# Patient Record
Sex: Female | Born: 1995 | Hispanic: Yes | Marital: Single | Smoking: Never smoker
Health system: Southern US, Community
[De-identification: ages and names within clinical notes are randomized; demographics above are authoritative.]

---

## 2018-06-30 ENCOUNTER — Other Ambulatory Visit: Payer: Self-pay

## 2018-06-30 ENCOUNTER — Telehealth: Payer: Self-pay

## 2018-06-30 DIAGNOSIS — Z20822 Contact with and (suspected) exposure to covid-19: Secondary | ICD-10-CM

## 2018-06-30 NOTE — Telephone Encounter (Signed)
Ariel Ferguson with [facility]. Requests COVID 19 test for symptoms. Reports she is at the test site now. Order placed.

## 2018-07-02 LAB — NOVEL CORONAVIRUS, NAA: SARS-CoV-2, NAA: NOT DETECTED

## 2018-07-05 ENCOUNTER — Telehealth: Payer: Self-pay

## 2018-07-05 NOTE — Telephone Encounter (Signed)
Patient called and was given her negative covid results by the [facility]. She was transferred to NT due to questions she had. The patient says that she was still having symptoms on Sunday-feeling fatigue, feeling feverish (no thermometer to check her temperature), chest feeling tight. She says yesterday all those symptoms went away, but she had a headache and a little fatigue, she took Ibuprofen. Today she says she feels better, no symptoms at all. I advised because she was still having symptoms, she will need to self isolate for 10 days and 3 days without fever and not taking any medication for fever, which means she will need to remain on self isolation until 07/12/18. She asked is there a note that she can give to her employer, I advised there is a note that can be sent through [facility] that indicates about self isolation for a negative test result with symptoms. I advised how to set up the MyChart account, she verbalized understanding of all advice given and received the code via text to set up [place].

## 2019-09-10 ENCOUNTER — Emergency Department: Payer: Self-pay

## 2019-09-10 ENCOUNTER — Emergency Department
Admission: EM | Admit: 2019-09-10 | Discharge: 2019-09-10 | Disposition: A | Discharge status: Home / Self Care | Payer: Self-pay | Attending: Emergency Medicine | Admitting: Emergency Medicine

## 2019-09-10 ENCOUNTER — Encounter: Payer: Self-pay | Admitting: Emergency Medicine

## 2019-09-10 ENCOUNTER — Other Ambulatory Visit: Payer: Self-pay

## 2019-09-10 DIAGNOSIS — R52 Pain, unspecified: Secondary | ICD-10-CM

## 2019-09-10 DIAGNOSIS — Y9384 Activity, sleeping: Secondary | ICD-10-CM | POA: Insufficient documentation

## 2019-09-10 DIAGNOSIS — Y999 Unspecified external cause status: Secondary | ICD-10-CM | POA: Insufficient documentation

## 2019-09-10 DIAGNOSIS — S8012XA Contusion of left lower leg, initial encounter: Secondary | ICD-10-CM | POA: Insufficient documentation

## 2019-09-10 DIAGNOSIS — W228XXA Striking against or struck by other objects, initial encounter: Secondary | ICD-10-CM | POA: Insufficient documentation

## 2019-09-10 DIAGNOSIS — Y92009 Unspecified place in unspecified non-institutional (private) residence as the place of occurrence of the external cause: Secondary | ICD-10-CM | POA: Insufficient documentation

## 2019-09-10 LAB — POCT PREGNANCY, URINE: Preg Test, Ur: NEGATIVE

## 2019-09-10 MED ORDER — TRAMADOL HCL 50 MG PO TABS
50.0000 mg | ORAL_TABLET | Freq: Four times a day (QID) | ORAL | 0 refills | Status: DC | PRN
Start: 2019-09-10 — End: 2019-09-17

## 2019-09-10 MED ORDER — TRAMADOL HCL 50 MG PO TABS
50.0000 mg | ORAL_TABLET | Freq: Once | ORAL | Status: AC
  Administered 2019-09-10: 50 mg via ORAL
  Filled 2019-09-10: qty 1

## 2019-09-10 MED ORDER — MELOXICAM 15 MG PO TABS: 15.0000 mg | ORAL_TABLET | Freq: Every day | ORAL | 0 refills | Status: DC

## 2019-09-10 MED ORDER — METHOCARBAMOL 500 MG PO TABS
500.0000 mg | ORAL_TABLET | Freq: Once | ORAL | Status: AC
  Administered 2019-09-10: 500 mg via ORAL
  Filled 2019-09-10: qty 1

## 2019-09-10 MED ORDER — METHOCARBAMOL 500 MG PO TABS: 500.0000 mg | ORAL_TABLET | Freq: Four times a day (QID) | ORAL | 0 refills | Status: DC

## 2019-09-10 MED ORDER — MELOXICAM 7.5 MG PO TABS
15.0000 mg | ORAL_TABLET | Freq: Once | ORAL | Status: AC
  Administered 2019-09-10: 15 mg via ORAL
  Filled 2019-09-10: qty 2
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
## 2019-09-12 ENCOUNTER — Other Ambulatory Visit: Payer: Self-pay

## 2019-09-12 ENCOUNTER — Inpatient Hospital Stay
Admission: AD | Admit: 2019-09-12 | Discharge: 2019-09-17 | DRG: 881 | Disposition: A | Discharge status: Home / Self Care | Payer: No Typology Code available for payment source | Source: Intra-hospital | Attending: Psychiatry | Admitting: Psychiatry

## 2019-09-12 ENCOUNTER — Emergency Department
Admission: EM | Admit: 2019-09-12 | Discharge: 2019-09-12 | Disposition: A | Discharge status: Home / Self Care | Payer: Self-pay | Attending: Emergency Medicine | Admitting: Emergency Medicine

## 2019-09-12 ENCOUNTER — Encounter: Payer: Self-pay | Admitting: Psychiatry

## 2019-09-12 DIAGNOSIS — F430 Acute stress reaction: Secondary | ICD-10-CM | POA: Diagnosis present

## 2019-09-12 DIAGNOSIS — R52 Pain, unspecified: Secondary | ICD-10-CM | POA: Insufficient documentation

## 2019-09-12 DIAGNOSIS — R45851 Suicidal ideations: Secondary | ICD-10-CM | POA: Insufficient documentation

## 2019-09-12 DIAGNOSIS — F329 Major depressive disorder, single episode, unspecified: Principal | ICD-10-CM | POA: Diagnosis present

## 2019-09-12 DIAGNOSIS — F4310 Post-traumatic stress disorder, unspecified: Secondary | ICD-10-CM | POA: Diagnosis present

## 2019-09-12 DIAGNOSIS — F331 Major depressive disorder, recurrent, moderate: Secondary | ICD-10-CM | POA: Insufficient documentation

## 2019-09-12 DIAGNOSIS — F322 Major depressive disorder, single episode, severe without psychotic features: Secondary | ICD-10-CM | POA: Diagnosis not present

## 2019-09-12 DIAGNOSIS — Z20822 Contact with and (suspected) exposure to covid-19: Secondary | ICD-10-CM | POA: Insufficient documentation

## 2019-09-12 DIAGNOSIS — F411 Generalized anxiety disorder: Secondary | ICD-10-CM | POA: Diagnosis present

## 2019-09-12 DIAGNOSIS — F515 Nightmare disorder: Secondary | ICD-10-CM | POA: Insufficient documentation

## 2019-09-12 LAB — CBC WITH DIFFERENTIAL/PLATELET
Abs Immature Granulocytes: 0.04 10*3/uL (ref 0.00–0.07)
Basophils Absolute: 0.1 10*3/uL (ref 0.0–0.1)
Basophils Relative: 1 %
Eosinophils Absolute: 0.1 10*3/uL (ref 0.0–0.5)
Eosinophils Relative: 1 %
HCT: 39.9 % (ref 36.0–46.0)
Hemoglobin: 13.6 g/dL (ref 12.0–15.0)
Immature Granulocytes: 0 %
Lymphocytes Relative: 26 %
Lymphs Abs: 2.5 10*3/uL (ref 0.7–4.0)
MCH: 28.4 pg (ref 26.0–34.0)
MCHC: 34.1 g/dL (ref 30.0–36.0)
MCV: 83.3 fL (ref 80.0–100.0)
Monocytes Absolute: 0.6 10*3/uL (ref 0.1–1.0)
Monocytes Relative: 6 %
Neutro Abs: 6.3 10*3/uL (ref 1.7–7.7)
Neutrophils Relative %: 66 %
Platelets: 385 10*3/uL (ref 150–400)
RBC: 4.79 MIL/uL (ref 3.87–5.11)
RDW: 12.1 % (ref 11.5–15.5)
WBC: 9.6 10*3/uL (ref 4.0–10.5)
nRBC: 0 % (ref 0.0–0.2)

## 2019-09-12 LAB — COMPREHENSIVE METABOLIC PANEL
ALT: 19 U/L (ref 0–44)
AST: 24 U/L (ref 15–41)
Albumin: 4.1 g/dL (ref 3.5–5.0)
Alkaline Phosphatase: 61 U/L (ref 38–126)
Anion gap: 10 (ref 5–15)
BUN: 13 mg/dL (ref 6–20)
CO2: 23 mmol/L (ref 22–32)
Calcium: 8.6 mg/dL — ABNORMAL LOW (ref 8.9–10.3)
Chloride: 102 mmol/L (ref 98–111)
Creatinine, Ser: 0.6 mg/dL (ref 0.44–1.00)
GFR calc Af Amer: >60 mL/min (ref >60)
GFR calc non Af Amer: >60 mL/min (ref >60)
Glucose, Bld: 89 mg/dL (ref 70–99)
Potassium: 3.4 mmol/L — ABNORMAL LOW (ref 3.5–5.1)
Sodium: 135 mmol/L (ref 135–145)
Total Bilirubin: 0.6 mg/dL (ref 0.3–1.2)
Total Protein: 7.5 g/dL (ref 6.5–8.1)

## 2019-09-12 LAB — SARS CORONAVIRUS 2 BY RT PCR (HOSPITAL ORDER, PERFORMED IN ~~LOC~~ HOSPITAL LAB): SARS Coronavirus 2: NEGATIVE

## 2019-09-12 LAB — ACETAMINOPHEN LEVEL: Acetaminophen (Tylenol), Serum: <10 ug/mL — ABNORMAL LOW (ref 10–30)

## 2019-09-12 LAB — SALICYLATE LEVEL: Salicylate Lvl: <7 mg/dL — ABNORMAL LOW (ref 7.0–30.0)

## 2019-09-12 MED ORDER — KETOROLAC TROMETHAMINE 30 MG/ML IJ SOLN
30.0000 mg | Freq: Once | INTRAMUSCULAR | Status: AC
  Administered 2019-09-12: 30 mg via INTRAMUSCULAR
  Filled 2019-09-12: qty 1

## 2019-09-12 MED ORDER — HYDROXYZINE HCL 50 MG PO TABS
50.0000 mg | ORAL_TABLET | Freq: Once | ORAL | Status: AC
  Administered 2019-09-12: 50 mg via ORAL
  Filled 2019-09-12: qty 1

## 2019-09-12 MED ORDER — MAGNESIUM HYDROXIDE 400 MG/5ML PO SUSP: 30.0000 mL | Freq: Every day | ORAL | Status: DC | PRN

## 2019-09-12 MED ORDER — ACETAMINOPHEN 325 MG PO TABS
650.0000 mg | ORAL_TABLET | Freq: Four times a day (QID) | ORAL | Status: DC | PRN
  Administered 2019-09-13 – 2019-09-17 (×6): 650 mg via ORAL
  Filled 2019-09-12 (×6): qty 2

## 2019-09-12 MED ORDER — ALUM & MAG HYDROXIDE-SIMETH 200-200-20 MG/5ML PO SUSP: 30.0000 mL | ORAL | Status: DC | PRN

## 2019-09-12 MED ORDER — HYDROXYZINE HCL 25 MG PO TABS
25.0000 mg | ORAL_TABLET | Freq: Three times a day (TID) | ORAL | Status: DC | PRN
  Administered 2019-09-12 – 2019-09-15 (×2): 25 mg via ORAL
  Filled 2019-09-12 (×2): qty 1

## 2019-09-12 MED ORDER — TRAZODONE HCL 50 MG PO TABS
50.0000 mg | ORAL_TABLET | Freq: Every evening | ORAL | Status: DC | PRN
  Administered 2019-09-12: 50 mg via ORAL
  Filled 2019-09-12: qty 1

## 2019-09-12 NOTE — ED Notes (Signed)
TTS in with pt  Pt informed TTS that she had S/I   Thoughts  No plan

## 2019-09-12 NOTE — ED Notes (Signed)
IVC, pend psych consult, moved to [facility]

## 2019-09-12 NOTE — ED Notes (Signed)
Pt will go to [facility] when Covid test results.

## 2019-09-12 NOTE — Plan of Care (Signed)
Patient new to the unit tonight, hasn't had time to progress  Problem: Education: Goal: Knowledge of [facility] Education information/materials will improve Outcome: Not Progressing Goal: Emotional status will improve Outcome: Not Progressing Goal: Mental status will improve Outcome: Not Progressing Goal: Verbalization of understanding the information provided will improve Outcome: Not Progressing   Problem: Safety: Goal: Periods of time without injury will increase Outcome: Not Progressing   Problem: Education: Goal: Ability to state activities that reduce stress will improve Outcome: Not Progressing   Problem: Self-Concept: Goal: Ability to identify factors that promote anxiety will improve Outcome: Not Progressing Goal: Level of anxiety will decrease Outcome: Not Progressing Goal: Ability to modify response to factors that promote anxiety will improve Outcome: Not Progressing   
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
## 2019-09-12 NOTE — BH Assessment (Addendum)
Patient can come down after 8pm    Call to give report: [phone]  Patient is to be admitted to [facility] by Dr. Lucianne Muss.  Attending Physician will be. Dr. Lucianne Muss.   Patient has been assigned to room 307, by [facility] Charge Nurse Maryelizabeth Kaufmann, RN.   Intake Paper Work has been signed and placed on patient chart.  ER staff is aware of the admission: 1. Misty Stanley, ER Secretary  2. Katrinka Blazing, ER MD  3. Amy Patient's Nurse  4. THO Patient Access.
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
## 2019-09-12 NOTE — ED Notes (Signed)
Report called to Amy B RN

## 2019-09-12 NOTE — ED Notes (Signed)
Pt given sandwich tray. RN called dining and requested a tray be sent for patient.
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
## 2019-09-13 NOTE — Plan of Care (Signed)
Pt rates depression 6/10, anxiety 8/10 and hopelessness 2/10. Pt was educated on care plan and verbalizes understanding. Pt was encouraged to attend groups. Torrie Mayers RN Problem: Education: Goal: Knowledge of [place] [facility] Education information/materials will improve Outcome: Progressing Goal: Emotional status will improve Outcome: Progressing Goal: Mental status will improve Outcome: Progressing Goal: Verbalization of understanding the information provided will improve Outcome: Progressing   Problem: Safety: Goal: Periods of time without injury will increase Outcome: Progressing   Problem: Education: Goal: Ability to state activities that reduce stress will improve Outcome: Progressing   Problem: Self-Concept: Goal: Ability to identify factors that promote anxiety will improve Outcome: Progressing Goal: Level of anxiety will decrease Outcome: Progressing Goal: Ability to modify response to factors that promote anxiety will improve Outcome: Progressing
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
## 2019-09-13 NOTE — [facility] Suicide Risk Assessment (Signed)
[facility] INPATIENT:  Family/Significant Other Suicide Prevention Education  Suicide Prevention Education:  Contact Attempts:  Debroah Shuttleworth, sister, [phone] has been identified by the patient as the family member/significant other with whom the patient will be residing, and identified as the person(s) who will aid the patient in the event of a mental health crisis.  With written consent from the patient, two attempts were made to provide suicide prevention education, prior to and/or following the patient's discharge.  We were unsuccessful in providing suicide prevention education.  A suicide education pamphlet was given to the patient to share with family/significant other.  Date and time of first attempt: 09/13/2019 at 1PM Date and time of second attempt: Second attempt is needed.  CSW spoke with someone who identified themself as Nettie Elm, however they indicated that a interpreter was necessary.  CSW was informed by patient that her sister spoke [place]. CSW will confirm with patient before continuing conversation as the discrepancy raises concerns.  CSW has no issue with using an interpreter if needed.   [name] 09/13/2019, 1:01 PM

## 2019-09-13 NOTE — Progress Notes (Signed)
Recreation Therapy Notes   Date: 09/12/2019  Time: 9:30 am  Location: Craft room   Behavioral response: Appropriate  Intervention Topic: Coping skills   Discussion/Intervention:  Group content on today was focused on coping skills. The group defined what coping skills are and when they normally use coping skills. Individuals described how they normally cope with thing and the coping skills they normally use. Patients expressed why it is important to cope with things and how not coping with things can affect you. The group participated in the intervention "My coping box" and made coping boxes while adding coping skills they could use in the future to the box. Clinical Observations/Feedback:  Patient came to group and was focused on what peers and staff had to say about coping skills. Individual was social with peers and staff while participating in the intervention.  Zarah Carbon LRT/CTRS         Lavoy Bernards 09/13/2019 11:33 AM
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
## 2019-09-14 MED ORDER — CLONAZEPAM 0.5 MG PO TABS
0.5000 mg | ORAL_TABLET | Freq: Two times a day (BID) | ORAL | Status: DC
  Administered 2019-09-14 – 2019-09-17 (×6): 0.5 mg via ORAL
  Filled 2019-09-14 (×6): qty 1

## 2019-09-14 MED ORDER — DULOXETINE HCL 20 MG PO CPEP
20.0000 mg | ORAL_CAPSULE | Freq: Every day | ORAL | Status: DC
  Administered 2019-09-14 – 2019-09-17 (×4): 20 mg via ORAL
  Filled 2019-09-14 (×5): qty 1

## 2019-09-14 NOTE — Progress Notes (Signed)
Recreation Therapy Notes  INPATIENT RECREATION TR PLAN  Patient Details Name: [name] MRN: [id number] DOB: April 28, 1995 Today's Date: 09/14/2019  Rec Therapy Plan Is patient appropriate for Therapeutic Recreation?: Yes Treatment times per week: at least 3 Estimated Length of Stay: 5-7 days TR Treatment/Interventions: Group participation (Comment)  Discharge Criteria    Discharge Summary     Ariel Ferguson 09/14/2019, 3:00 PM

## 2019-09-14 NOTE — Plan of Care (Signed)
  Problem: Education: Goal: Emotional status will improve Outcome: Progressing Goal: Mental status will improve Outcome: Progressing Goal: Verbalization of understanding the information provided will improve Outcome: Progressing   Problem: Education: Goal: Mental status will improve Outcome: Progressing   Problem: Education: Goal: Verbalization of understanding the information provided will improve Outcome: Progressing   Problem: Safety: Goal: Periods of time without injury will increase Outcome: Progressing

## 2019-09-14 NOTE — Progress Notes (Signed)
Recreation Therapy Notes  Date: 09/14/2019  Time: 10:30 am  Location: Craft room   Behavioral response: Appropriate  Intervention Topic: Leisure   Discussion/Intervention:  Group content today was focused on leisure. The group defined what leisure is and some positive leisure activities they participate in. Individuals identified the difference between good and bad leisure. Participants expressed how they feel after participating in the leisure of their choice. The group discussed how they go about picking a leisure activity and if others are involved in their leisure activities. The patient stated how many leisure activities they have to choose from and reasons why it is important to have leisure time. Individuals participated in the intervention Exploration of Leisure where they had a chance to identify new leisure activities as well as benefits of leisure. Clinical Observations/Feedback:  Patient came to group and identified soccer, meditation, reading and watching the water flow as leisure activities she enjoys. Individual was social with peers and staff while participating in the intervention.  Mionna Advincula LRT/CTRS         Abbeygail Igoe 09/14/2019 2:27 PM

## 2019-09-14 NOTE — Plan of Care (Signed)
Pt rates depression 4/10, anxiety 6/10. Pt denies SI, HI and AVH. Pt was educated on care plan and verbalizes understanding. Pt was encouraged to attend groups. Torrie Mayers RN Problem: Education: Goal: Knowledge of [place] [facility] Education information/materials will improve Outcome: Progressing Goal: Emotional status will improve Outcome: Progressing Goal: Mental status will improve Outcome: Progressing Goal: Verbalization of understanding the information provided will improve Outcome: Progressing   Problem: Safety: Goal: Periods of time without injury will increase Outcome: Progressing   Problem: Education: Goal: Ability to state activities that reduce stress will improve Outcome: Progressing   Problem: Education: Goal: Ability to state activities that reduce stress will improve Outcome: Progressing   Problem: Self-Concept: Goal: Ability to identify factors that promote anxiety will improve Outcome: Progressing Goal: Level of anxiety will decrease Outcome: Progressing Goal: Ability to modify response to factors that promote anxiety will improve Outcome: Progressing
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
## 2019-09-14 NOTE — Progress Notes (Signed)
Patient presents with sad, flat affect. Reports she feels scared. Writer ensured patient will be safe on unit with monitoring. Patient complains of generalized pain. Tylenol given with good relief. In room all of shift in bed in no distress. Encouragement and support provided. Safety checks maintained. Medications given as prescribed. Pt receptive and remains safe on unit with q 15 min checks.

## 2019-09-14 NOTE — Progress Notes (Signed)
D- Patient alert and oriented. Affect/mood is sad but calm and cooperative.Pt denies SI, HI, AVH, and pain. Pt attended groups. Torrie Mayers RN  A- Scheduled medications administered to patient, per MD orders. Support and encouragement provided.  Routine safety checks conducted every 15 minutes.  Patient informed to notify staff with problems or concerns.  R- No adverse drug reactions noted. Patient contracts for safety at this time. Patient compliant with medications and treatment plan. Patient receptive, calm, and cooperative. Patient interacts well with others on the unit.  Patient remains safe at this time.  Torrie Mayers Rn

## 2019-09-14 NOTE — Progress Notes (Signed)
Patient is quiet and reserved. She is pleasant and soft spoken upon engagement. She is alert and oriented x 4. She reports doing better and reports getting some sleep has helped her tremendously.  She denies SI/HI/AVH depression anxiety and pain at this encounter. She has recently awoke from sleeping and states she does not need any mediation to aid in sleep at this time.  Patient was informed to contact staff with anyconcerns she may have. She remains safe at this time with 15 minute rounding.    Cleo Butler-Nicholson, LPN

## 2019-09-14 NOTE — Progress Notes (Signed)
BRIEF PHARMACY NOTE   This patient attended and participated in Medication Management Group counseling led by [facility] staff pharmacist.  This interactive class reviews basic information about prescription medications and education on personal responsibility in medication management.  The class also includes general knowledge of 3 main classes of behavioral medications, including antipsychotics, antidepressants, and mood stabilizers.     Patient behavior was appropriate for group setting.   Educational materials sourced from:  "Medication Do's and Don'ts" from [email or web address]   "Mental Health Medications" from [facility] FaxRack.tn.shtml#part [email or web address]     Albina Billet ,PharmD 09/14/2019 , 3:51 PM

## 2019-09-14 NOTE — Plan of Care (Signed)
  Problem: Education: Goal: Knowledge of  General Education information/materials will improve Outcome: Progressing Goal: Emotional status will improve Outcome: Progressing Goal: Mental status will improve Outcome: Progressing Goal: Verbalization of understanding the information provided will improve Outcome: Progressing   Problem: Safety: Goal: Periods of time without injury will increase Outcome: Progressing   Problem: Education: Goal: Ability to state activities that reduce stress will improve Outcome: Progressing   Problem: Self-Concept: Goal: Ability to identify factors that promote anxiety will improve Outcome: Progressing Goal: Level of anxiety will decrease Outcome: Progressing Goal: Ability to modify response to factors that promote anxiety will improve Outcome: Progressing

## 2019-09-14 NOTE — Tx Team (Addendum)
Interdisciplinary Treatment and Diagnostic Plan Update  09/14/2019 Time of Session: 9:30AM Ariel Ferguson MRN: [id number]  Principal Diagnosis: <principal problem not specified>  Secondary Diagnoses: Active Problems:   MDD (major depressive disorder), severe (HCC)   Current Medications:  Current Facility-Administered Medications  Medication Dose Route Frequency Provider Last Rate Last Admin  . acetaminophen (TYLENOL) tablet 650 mg  650 mg Oral Q6H PRN Money, Gerlene Burdock, FNP   650 mg at 09/14/19 1209  . alum & mag hydroxide-simeth (MAALOX/MYLANTA) 200-200-20 MG/5ML suspension 30 mL  30 mL Oral Q4H PRN Money, Gerlene Burdock, FNP      . clonazePAM (KLONOPIN) tablet 0.5 mg  0.5 mg Oral BID Roselind Messier, MD      . DULoxetine (CYMBALTA) DR capsule 20 mg  20 mg Oral Daily Roselind Messier, MD   20 mg at 09/14/19 1357  . hydrOXYzine (ATARAX/VISTARIL) tablet 25 mg  25 mg Oral TID PRN Money, Gerlene Burdock, FNP   25 mg at 09/12/19 2323  . magnesium hydroxide (MILK OF MAGNESIA) suspension 30 mL  30 mL Oral Daily PRN Money, Gerlene Burdock, FNP       PTA Medications: Medications Prior to Admission  Medication Sig Dispense Refill Last Dose  . meloxicam (MOBIC) 15 MG tablet Take 1 tablet (15 mg total) by mouth daily. 30 tablet 0   . methocarbamol (ROBAXIN) 500 MG tablet Take 1 tablet (500 mg total) by mouth 4 (four) times daily. 16 tablet 0   . traMADol (ULTRAM) 50 MG tablet Take 1 tablet (50 mg total) by mouth every 6 (six) hours as needed. 12 tablet 0     Patient Stressors: Traumatic event  Patient Strengths: Barrister's clerk for treatment/growth Physical Health Supportive family/friends  Treatment Modalities: Medication Management, Group therapy, Case management,  1 to 1 session with clinician, Psychoeducation, Recreational therapy.   Physician Treatment Plan for Primary Diagnosis: <principal problem not specified> Long Term Goal(s):     Short Term Goals:    Medication Management:  Evaluate patient's response, side effects, and tolerance of medication regimen.  Therapeutic Interventions: 1 to 1 sessions, Unit Group sessions and Medication administration.  Evaluation of Outcomes: Progressing  Physician Treatment Plan for Secondary Diagnosis: Active Problems:   MDD (major depressive disorder), severe (HCC)  Long Term Goal(s):     Short Term Goals:       Medication Management: Evaluate patient's response, side effects, and tolerance of medication regimen.  Therapeutic Interventions: 1 to 1 sessions, Unit Group sessions and Medication administration.  Evaluation of Outcomes: Progressing   RN Treatment Plan for Primary Diagnosis: <principal problem not specified> Long Term Goal(s): Knowledge of disease and therapeutic regimen to maintain health will improve  Short Term Goals: Ability to demonstrate self-control, Ability to participate in decision making will improve, Ability to verbalize feelings will improve, Ability to disclose and discuss suicidal ideas, Ability to identify and develop effective coping behaviors will improve and Compliance with prescribed medications will improve  Medication Management: RN will administer medications as ordered by provider, will assess and evaluate patient's response and provide education to patient for prescribed medication. RN will report any adverse and/or side effects to prescribing provider.  Therapeutic Interventions: 1 on 1 counseling sessions, Psychoeducation, Medication administration, Evaluate responses to treatment, Monitor vital signs and CBGs as ordered, Perform/monitor CIWA, COWS, AIMS and Fall Risk screenings as ordered, Perform wound care treatments as ordered.  Evaluation of Outcomes: Progressing   LCSW Treatment Plan for Primary Diagnosis: <principal problem not specified> Long Term  Goal(s): Safe transition to appropriate next level of care at discharge, Engage patient in therapeutic group addressing  interpersonal concerns.  Short Term Goals: Engage patient in aftercare planning with referrals and resources, Increase social support, Increase ability to appropriately verbalize feelings, Increase emotional regulation, Facilitate acceptance of mental health diagnosis and concerns and Increase skills for wellness and recovery  Therapeutic Interventions: Assess for all discharge needs, 1 to 1 time with Social worker, Explore available resources and support systems, Assess for adequacy in community support network, Educate family and significant other(s) on suicide prevention, Complete Psychosocial Assessment, Interpersonal group therapy.  Evaluation of Outcomes: Progressing   Progress in Treatment: Attending groups: Yes. Participating in groups: Yes. Taking medication as prescribed: Yes. Toleration medication: Yes. Family/Significant other contact made: Yes, individual(s) contacted:  SPE attempts have been made with the patient's family Patient understands diagnosis: Yes. Discussing patient identified problems/goals with staff: Yes. Medical problems stabilized or resolved: Yes. Denies suicidal/homicidal ideation: Yes. Issues/concerns per patient self-inventory: No. Other: none  New problem(s) identified: No, Describe:  none  New Short Term/Long Term Goal(s): medication management for mood stabilization; elimination of SI thoughts; development of comprehensive mental wellness/sobriety plan.  Patient Goals:  "to get back to normal"  Discharge Plan or Barriers: Patient reports plans to return to her home with her family.  She reports apprehension to returning to her home following her discharge.  She reports that she will follow up with aftercare treatment.    Reason for Continuation of Hospitalization: Anxiety Depression Medication stabilization Suicidal ideation  Estimated Length of Stay:  1-7 days  Recreational Therapy: Patient: N/A Patient Goal: Patient will engage in groups  without prompting or encouragement from LRT x3 group sessions within 5 recreation therapy group sessions  Attendees: Patient:  Ariel Ferguson 09/14/2019 3:33 PM  Physician: Dr. Smith Robert, MD 09/14/2019 3:33 PM  Nursing: Torrie Mayers, RN 09/14/2019 3:33 PM  RN Care Manager: 09/14/2019 3:33 PM  Social Worker: Penni Homans, LCSW 09/14/2019 3:33 PM  Recreational Therapist: Hilbert Bible, LRT 09/14/2019 3:33 PM  Other:  09/14/2019 3:33 PM  Other:  09/14/2019 3:33 PM  Other: 09/14/2019 3:33 PM    Scribe for Treatment Team: [name], LCSW 09/14/2019 3:33 PM

## 2019-09-14 NOTE — Progress Notes (Signed)
Recreation Therapy Notes  INPATIENT RECREATION THERAPY ASSESSMENT  Patient Details Name: Ariel Ferguson MRN: [id number] DOB: 1995/10/25 Today's Date: 09/14/2019       Information Obtained From: Patient  Able to Participate in Assessment/Interview: Yes  Patient Presentation: Responsive  Reason for Admission (Per Patient): Active Symptoms, Suicidal Ideation, Suicide Attempt  Patient Stressors:    Coping Skills:   Talk, Read  Leisure Interests (2+):  Exercise - Walking, Social - Family (soccer)  Frequency of Recreation/Participation: Monthly  Awareness of Community Resources:  Yes  Community Resources:  Park  Current Use: Yes  If no, Barriers?:    Expressed Interest in [employer] Information:    [place] of Residence:  Film/video editor  Patient Main Form of Transportation: Car  Patient Strengths:  Helping others  Patient Identified Areas of Improvement:  Think positive  Patient Goal for Hospitalization:  To get better  Current SI (including self-harm):  No  Current HI:  No  Current AVH: No  Staff Intervention Plan: Group Attendance, Collaborate with Interdisciplinary Treatment Team  Consent to Intern Participation: N/A  Aveena Bari 09/14/2019, 3:00 PM
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
## 2019-09-15 NOTE — Progress Notes (Signed)
Recreation Therapy Notes  Date: 09/15/2019  Time: 9:30 am  Location: Craft room   Behavioral response: Appropriate  Intervention Topic: Communication   Discussion/Intervention:  Group content today was focused on communication. The group defined communication and ways to communicate with others. Individuals stated reason why communication is important and some reasons to communicate with others. Patients expressed if they thought they were good at communicating with others and ways they could improve their communication skills. The group identified important parts of communication and some experiences they have had in the past with communication. The group participated in the intervention "What is that?", where they had a chance to test out their communication skills and identify ways to improve their communication techniques.  Clinical Observations/Feedback:  Patient came to group and defined communication as talking. She explained that she likes to communicate with others by writing and texting. Participant expressed that it is important to communicate with others so people will hear you.  Individual was social with peers and staff while participating in the intervention.  Amarilys Lyles LRT/CTRS         Reverie Vaquera 09/15/2019 1:14 PM
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
## 2019-09-15 NOTE — Plan of Care (Signed)
D- Patient alert and oriented. Patient presented in a pleasant mood on assessment stating that she "got some sleep" last night and had some complaints of pain. Patient reported back and left leg pain, rating her pain a "5/10", in which she did request PRN medications from this Clinical research associate. Patient denied any signs/symptoms of depression/anxiety stating that "last night I was really scared , but today I'm feeling good". Patient also denied SI, HI, AVH at this time. Patient had no stated goals for today   A- Scheduled medications administered to patient, per MD orders. Support and encouragement provided.  Routine safety checks conducted every 15 minutes.  Patient informed to notify staff with problems or concerns.  R- No adverse drug reactions noted. Patient contracts for safety at this time. Patient compliant with medications and treatment plan. Patient receptive, calm, and cooperative. Patient interacts well with others on the unit.  Patient remains safe at this time.  Problem: Education: Goal: Knowledge of [place] [facility] Education information/materials will improve Outcome: Progressing Goal: Emotional status will improve Outcome: Progressing Goal: Mental status will improve Outcome: Progressing Goal: Verbalization of understanding the information provided will improve Outcome: Progressing   Problem: Safety: Goal: Periods of time without injury will increase Outcome: Progressing   Problem: Education: Goal: Ability to state activities that reduce stress will improve Outcome: Progressing   Problem: Self-Concept: Goal: Ability to identify factors that promote anxiety will improve Outcome: Progressing Goal: Level of anxiety will decrease Outcome: Progressing Goal: Ability to modify response to factors that promote anxiety will improve Outcome: Progressing

## 2019-09-15 NOTE — [facility] Group Notes (Signed)
[facility] Group Notes:  (Nursing/MHT/Case Management/Adjunct)  Date:  09/15/2019  Time:  9:23 PM  Type of Therapy:  Group Therapy  Participation Level:  Active  Participation Quality:  Appropriate  Affect:  Appropriate  Cognitive:  Alert  Insight:  Good  Engagement in Group:  Engaged and goal was to think positive.  Modes of Intervention:  Support  Summary of Progress/Problems:  Ariel Ferguson 09/15/2019, 9:23 PM

## 2019-09-16 DIAGNOSIS — F322 Major depressive disorder, single episode, severe without psychotic features: Secondary | ICD-10-CM

## 2019-09-16 NOTE — [facility] Group Notes (Signed)
LCSW Group Therapy Note  09/16/2019   1:20 PM- 2:08 PM   Type of Therapy and Topic:  Group Therapy: Anger Cues and Responses  Participation Level:  Active   Description of Group:   In this group, patients learned how to recognize the physical, cognitive, emotional, and behavioral responses they have to anger-provoking situations.  They identified a recent time they became angry and how they reacted.  They analyzed how their reaction was possibly beneficial and how it was possibly unhelpful.  The group discussed a variety of healthier coping skills that could help with such a situation in the future.  Focus was placed on how helpful it is to recognize the underlying emotions to our anger, because working on those can lead to a more permanent solution as well as our ability to focus on the important rather than the urgent.  Therapeutic Goals: 1. Patients will remember their last incident of anger and how they felt emotionally and physically, what their thoughts were at the time, and how they behaved. 2. Patients will identify how their behavior at that time worked for them, as well as how it worked against them. 3. Patients will explore possible new behaviors to use in future anger situations. 4. Patients will learn that anger itself is normal and cannot be eliminated, and that healthier reactions can assist with resolving conflict rather than worsening situations.  Summary of Patient Progress:  Patient checked into group feeling pretty good. Patient was not able to identify the last time she was angry. Patient stated that she does not get angry. Patient stated if she gets upset she will walk away and ignore the person. CSW asked patient to identify some coping skills she uses when she is upset. Patient stated that she likes walking, going to the river, and taking a hot bath.   Therapeutic Modalities:   Cognitive Behavioral Therapy    Susa Simmonds, Theresia Majors 09/16/2019  2:47 PM
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
## 2019-09-16 NOTE — Progress Notes (Signed)
[facility] MD Progress Note  09/16/2019 3:00 PM Ariel Ferguson  MRN:  [id number]  Principal Problem: <principal problem not specified> Diagnosis: Active Problems:   MDD (major depressive disorder), severe (HCC)  Ariel Ferguson is a 24 y.o. female that has a previous psychiatric history of depressive disorder and PTSD who presents to the [facility] unit for treatment of worsened depression, anxiety in the context of recent acute stress.   Interval History  Patient was seen today for re-evaluation.  Nursing reports no events overnight. The patient reports no issues with performing ADLs.  Patient has been medication compliant.  The patient reports no side effects from medications.  Current symptoms being addressed include:  ...  Since last assessment, patient reports symptoms have improved greatly.    SUBJECTIVE: On assessment patient reports "I feel good, so much better when on admission. I feel normal". She reports good mood, states symptoms of depression and anxiety had improved - denies feeling currently depressed, denies thoughts of harming self, others. Reports improvement of PTSD symptoms too, such as nightmares and sleep in general. Denies any hallucinations, does not express delusions. No side effects from medications. She thinks she is ready for discharge. She lives with parents and siblings, they are supportive. She states she can contract for safety if discharged.     Current suicidal/homicidal ideations: Denies Current auditory/visual hallucinations: Denies  Review Of Systems: A complete review of systems of the following systems was conducted (Constitutional, Psychiatric, Neurological, Musculoskeletal, Eyes, Gastrointestinal, Cardiovascular, Respiratory, Skin, and Endocrine). All reviewed systems are negative except pertinent positives identified in the HPI.  Labs:     Total Time spent with patient: 20 minutes  Past Psychiatric History: see H&P  Past Medical History: History reviewed. No pertinent  past medical history. History reviewed. No pertinent surgical history. Family History: History reviewed. No pertinent family history. Family Psychiatric  History: see H&P Social History:  Social History   Substance and Sexual Activity  Alcohol Use None     Social History   Substance and Sexual Activity  Drug Use Not on file    Social History   Socioeconomic History  . Marital status: Single    Spouse name: Not on file  . Number of children: Not on file  . Years of education: Not on file  . Highest education level: Not on file  Occupational History  . Not on file  Tobacco Use  . Smoking status: Never Smoker  . Smokeless tobacco: Never Used  Substance and Sexual Activity  . Alcohol use: Not on file  . Drug use: Not on file  . Sexual activity: Not on file  Other Topics Concern  . Not on file  Social History Narrative  . Not on file   Social Determinants of Health   Financial Resource Strain:   . Difficulty of Paying Living Expenses: Not on file  Food Insecurity:   . Worried About Programme researcher, broadcasting/film/video in the Last Year: Not on file  . Ran Out of Food in the Last Year: Not on file  Transportation Needs:   . Lack of Transportation (Medical): Not on file  . Lack of Transportation (Non-Medical): Not on file  Physical Activity:   . Days of Exercise per Week: Not on file  . Minutes of Exercise per Session: Not on file  Stress:   . Feeling of Stress : Not on file  Social Connections:   . Frequency of Communication with Friends and Family: Not on file  . Frequency of Social  Gatherings with Friends and Family: Not on file  . Attends Religious Services: Not on file  . Active Member of Clubs or Organizations: Not on file  . Attends Banker Meetings: Not on file  . Marital Status: Not on file   Additional Social History:                         Sleep: Good  Appetite:  Good  Current Medications: Current Facility-Administered Medications   Medication Dose Route Frequency Provider Last Rate Last Admin  . acetaminophen (TYLENOL) tablet 650 mg  650 mg Oral Q6H PRN Money, Gerlene Burdock, FNP   650 mg at 09/15/19 2153  . alum & mag hydroxide-simeth (MAALOX/MYLANTA) 200-200-20 MG/5ML suspension 30 mL  30 mL Oral Q4H PRN Money, Gerlene Burdock, FNP      . clonazePAM (KLONOPIN) tablet 0.5 mg  0.5 mg Oral BID Roselind Messier, MD   0.5 mg at 09/16/19 1610  . DULoxetine (CYMBALTA) DR capsule 20 mg  20 mg Oral Daily Roselind Messier, MD   20 mg at 09/16/19 9604  . hydrOXYzine (ATARAX/VISTARIL) tablet 25 mg  25 mg Oral TID PRN Money, Gerlene Burdock, FNP   25 mg at 09/15/19 2151  . magnesium hydroxide (MILK OF MAGNESIA) suspension 30 mL  30 mL Oral Daily PRN Money, Gerlene Burdock, FNP        Lab Results: No results found for this or any previous visit (from the past 48 hour(s)).  Blood Alcohol level:  No results found for: [facility]  Metabolic Disorder Labs: No results found for: HGBA1C, MPG No results found for: PROLACTIN No results found for: CHOL, TRIG, HDL, CHOLHDL, VLDL, LDLCALC  Physical Findings: AIMS:  , ,  ,  ,    CIWA:    COWS:     Musculoskeletal: Strength & Muscle Tone: within normal limits Gait & Station: normal Patient leans: N/A  Psychiatric Specialty Exam: Physical Exam  Review of Systems  Blood pressure (!) 124/92, pulse 77, temperature 98.6 F (37 C), temperature source Oral, resp. rate 18, height 5\' 3"  (1.6 m), weight 78.9 kg, last menstrual period 09/09/2019, SpO2 100 %.Body mass index is 30.82 kg/m.  General Appearance: Casual and Well Groomed  Eye Contact:  Good  Speech:  Clear and Coherent and Normal Rate  Volume:  Normal  Mood:  Euthymic  Affect:  Full Range  Thought Process:  Coherent, Goal Directed and Linear  Orientation:  Full (Time, Place, and Person)  Thought Content:  Logical  Suicidal Thoughts:  No  Homicidal Thoughts:  No  Memory:  Immediate;   Good Recent;   Good Remote;   Good  Judgement:  Good  Insight:   Good  Psychomotor Activity:  Normal  Concentration:  Concentration: Fair and Attention Span: Fair  Recall:  09/11/2019 of Knowledge:  Fair  Language:  Good  Akathisia:  No  Handed:  Right  AIMS (if indicated):     Assets:  Communication Skills Desire for Improvement Housing Physical Health Social Support Transportation  ADL's:  Intact  Cognition:  WNL  Sleep:  Number of Hours: 6.75     Treatment Plan Summary: Daily contact with patient to assess and evaluate symptoms and progress in treatment and Medication management   Patient is a 24 year old female/female with the above-stated past psychiatric history who is seen in follow-up.  Chart reviewed. Patient discussed with nursing. Patient reports her initial symptoms of depression and PTSD had improved  greatly over the course of hospitalization. She denies current complaints, denies unsafe thioughts. Will reassess the patient tomorrow, if she still appears stable - likely will discharge.   Plan:  -continue inpatient psych admission; 15-minute checks; daily contact with patient to assess and evaluate symptoms and progress in treatment; psychoeducation.  -continue scheduled psych medications: . clonazePAM  0.5 mg Oral BID  . DULoxetine  20 mg Oral Daily    -continue PRN medications.  acetaminophen, alum & mag hydroxide-simeth, hydrOXYzine, magnesium hydroxide  -Pertinent Labs: no new labs ordered today     -Consults: No new consults placed since yesterday    -Disposition: Estimated duration of hospitalization: 1-2 days. All necessary aftercare will be arranged prior to discharge Likely d/c home with outpatient psych follow-up.  -  I certify that the patient does need, on a daily basis, active treatment furnished directly by or requiring the supervision of inpatient psychiatric facility personnel.    Thalia Party, MD 09/16/2019, 3:00 PM

## 2019-09-16 NOTE — Plan of Care (Signed)
  Problem: Education: Goal: Knowledge of [place] [facility] Education information/materials will improve Outcome: Progressing Goal: Emotional status will improve Outcome: Progressing Goal: Mental status will improve Outcome: Progressing Goal: Verbalization of understanding the information provided will improve Outcome: Progressing   Problem: Safety: Goal: Periods of time without injury will increase Outcome: Progressing   

## 2019-09-16 NOTE — Progress Notes (Signed)
D: Patient is alert and oriented. Patient rates depression 0/10, hopelessness 0/10, and anxiety 0/10. Patient stated goal is" being discharged, I want to see my family I miss them".  Patient reports energy level as normal and concentration as good. Patient reports slept good last night. Patient did not receive medication for sleep. Rates physical pain as 4/10 (leg & back) and that medication has been helpful. Denies SI,HI, or AVH at this time.   A: Scheduled medications administered to patient per MD orders. Reassurance, support and encouragement provided. Verbally contracts for safety. Routine unit safety checks conducted Q 15 minutes.   R: Patient adhered to medication administration. No adverse drug reactions noted. Interacts well with others in milieu. Remains safe at this time, will continue to monitor.

## 2019-09-16 NOTE — Progress Notes (Signed)
Patient has been calm and cooperative. Denies SI, HI or AVH. Interacting appropriately with staff and peers. Mood is pleasant. Affect is appropriate to circumstance. Contracting for safety

## 2019-09-17 MED ORDER — DULOXETINE HCL 20 MG PO CPEP
20.0000 mg | ORAL_CAPSULE | Freq: Every day | ORAL | 1 refills | Status: AC
Start: 2019-09-18 — End: 2019-10-18

## 2019-09-17 MED ORDER — HYDROXYZINE HCL 25 MG PO TABS
25.0000 mg | ORAL_TABLET | Freq: Three times a day (TID) | ORAL | 1 refills | Status: AC | PRN
Start: 2019-09-17 — End: 2019-10-17

## 2019-09-17 NOTE — Progress Notes (Signed)
Patient ID: Ariel Ferguson, female   DOB: 1995-03-23, 23 y.o.   MRN: [id number]   Discharge Note:  Patient denies SI/HI/AVH at this time. Discharge instructions, transition record, AVS, prescriptions gone over with patient.  Patient agrees to comply with medication management, follow-up visit, and outpatient therapy. Patient questions and concerns addressed and answered. Belongings returned to patient. Patient ambulatory with no complaints discharged to home with mom and sister.

## 2019-09-17 NOTE — Progress Notes (Signed)
[facility] MD Progress Note  09/17/2019 10:28 AM Lakisha Peyser  MRN:  [id number]  Principal Problem: <principal problem not specified> Diagnosis: Active Problems:   MDD (major depressive disorder), severe (HCC)  Ariel Ferguson is a 24 y.o. female that has a previous psychiatric history of depressive disorder and PTSD who presents to the [facility] unit for treatment of worsened depression, anxiety in the context of recent acute stress.   Interval History Patient was seen today for re-evaluation.  Nursing reports no events overnight. The patient reports no issues with performing ADLs.  Patient has been medication compliant.  The patient reports no side effects from medications.  Current symptoms being addressed include:  ...  Since last assessment, patient reports symptoms have improved greatly.    SUBJECTIVE: On assessment patient reports "I feel good, l really want to go home". She reports good mood, states symptoms of depression and anxiety had improved - denies feeling currently depressed, denies thoughts of harming self, others. Reports improvement of PTSD symptoms too, such as nightmares and sleep in general. Denies any hallucinations, does not express delusions. No side effects from medications. She thinks she is ready for discharge. She lives with parents and siblings, they are supportive. She states she can contract for safety if discharged.    Colletaral info obtained from sister, Genella Rife [phone]): she spoke with the patient yesterday, she thinks patient`s mood has improved over the course of hospitalization. Patient did not express feeling depressed, did not mentioned any thoughts of harming self or others. Sister thinks patient can be discharged home safely today. Patient lives with parents and siblings; her mother is not working and is at home "all the time" and observe patient for safety. The family will make sure patient follows with an outpatient psychiatrist. Sister can pick the patient up from the  hospital today in the afternoon.  Current suicidal/homicidal ideations: Denies Current auditory/visual hallucinations: Denies  Review Of Systems: A complete review of systems of the following systems was conducted (Constitutional, Psychiatric, Neurological, Musculoskeletal, Eyes, Gastrointestinal, Cardiovascular, Respiratory, Skin, and Endocrine). All reviewed systems are negative except pertinent positives identified in the HPI.  Labs: N/A    Total Time spent with patient: 20 minutes  Past Psychiatric History: see H&P  Past Medical History: History reviewed. No pertinent past medical history. History reviewed. No pertinent surgical history. Family History: History reviewed. No pertinent family history. Family Psychiatric  History: see H&P Social History:  Social History   Substance and Sexual Activity  Alcohol Use None     Social History   Substance and Sexual Activity  Drug Use Not on file    Social History   Socioeconomic History  . Marital status: Single    Spouse name: Not on file  . Number of children: Not on file  . Years of education: Not on file  . Highest education level: Not on file  Occupational History  . Not on file  Tobacco Use  . Smoking status: Never Smoker  . Smokeless tobacco: Never Used  Substance and Sexual Activity  . Alcohol use: Not on file  . Drug use: Not on file  . Sexual activity: Not on file  Other Topics Concern  . Not on file  Social History Narrative  . Not on file   Social Determinants of Health   Financial Resource Strain:   . Difficulty of Paying Living Expenses: Not on file  Food Insecurity:   . Worried About Programme researcher, broadcasting/film/video in the Last Year: Not on file  .  Ran Out of Food in the Last Year: Not on file  Transportation Needs:   . Lack of Transportation (Medical): Not on file  . Lack of Transportation (Non-Medical): Not on file  Physical Activity:   . Days of Exercise per Week: Not on file  . Minutes of Exercise per  Session: Not on file  Stress:   . Feeling of Stress : Not on file  Social Connections:   . Frequency of Communication with Friends and Family: Not on file  . Frequency of Social Gatherings with Friends and Family: Not on file  . Attends Religious Services: Not on file  . Active Member of Clubs or Organizations: Not on file  . Attends Banker Meetings: Not on file  . Marital Status: Not on file   Additional Social History:                         Sleep: Good  Appetite:  Good  Current Medications: Current Facility-Administered Medications  Medication Dose Route Frequency Provider Last Rate Last Admin  . acetaminophen (TYLENOL) tablet 650 mg  650 mg Oral Q6H PRN Money, Gerlene Burdock, FNP   650 mg at 09/17/19 0809  . alum & mag hydroxide-simeth (MAALOX/MYLANTA) 200-200-20 MG/5ML suspension 30 mL  30 mL Oral Q4H PRN Money, Gerlene Burdock, FNP      . clonazePAM (KLONOPIN) tablet 0.5 mg  0.5 mg Oral BID Roselind Messier, MD   0.5 mg at 09/17/19 0807  . DULoxetine (CYMBALTA) DR capsule 20 mg  20 mg Oral Daily Roselind Messier, MD   20 mg at 09/17/19 4665  . hydrOXYzine (ATARAX/VISTARIL) tablet 25 mg  25 mg Oral TID PRN Money, Gerlene Burdock, FNP   25 mg at 09/15/19 2151  . magnesium hydroxide (MILK OF MAGNESIA) suspension 30 mL  30 mL Oral Daily PRN Money, Gerlene Burdock, FNP        Lab Results: No results found for this or any previous visit (from the past 48 hour(s)).  Blood Alcohol level:  No results found for: [facility]  Metabolic Disorder Labs: No results found for: HGBA1C, MPG No results found for: PROLACTIN No results found for: CHOL, TRIG, HDL, CHOLHDL, VLDL, LDLCALC  Physical Findings: AIMS:  , ,  ,  ,    CIWA:    COWS:     Musculoskeletal: Strength & Muscle Tone: within normal limits Gait & Station: normal Patient leans: N/A  Psychiatric Specialty Exam: Physical Exam   Review of Systems   Blood pressure 127/76, pulse 81, temperature 98.1 F (36.7 C), temperature  source Oral, resp. rate 18, height 5\' 3"  (1.6 m), weight 78.9 kg, last menstrual period 09/09/2019, SpO2 100 %.Body mass index is 30.82 kg/m.  General Appearance: Casual and Well Groomed  Eye Contact:  Good  Speech:  Clear and Coherent and Normal Rate  Volume:  Normal  Mood:  Euthymic  Affect:  Full Range  Thought Process:  Coherent, Goal Directed and Linear  Orientation:  Full (Time, Place, and Person)  Thought Content:  Logical  Suicidal Thoughts:  No  Homicidal Thoughts:  No  Memory:  Immediate;   Good Recent;   Good Remote;   Good  Judgement:  Good  Insight:  Good  Psychomotor Activity:  Normal  Concentration:  Concentration: Fair and Attention Span: Fair  Recall:  09/11/2019 of Knowledge:  Fair  Language:  Good  Akathisia:  No  Handed:  Right  AIMS (if indicated):  Assets:  Communication Skills Desire for Improvement Housing Physical Health Social Support Transportation  ADL's:  Intact  Cognition:  WNL  Sleep:  Number of Hours: 6.45     Treatment Plan Summary: Daily contact with patient to assess and evaluate symptoms and progress in treatment and Medication management   Patient is a 24 year old female/female with the above-stated past psychiatric history who is seen in follow-up.  Chart reviewed. Patient discussed with nursing. Patient reports her initial symptoms of depression and PTSD had improved greatly over the course of hospitalization. She denies current complaints, denies unsafe thioughts. Spoke with patient`s sister, who expressed no safety concerns and thinks the patient can be discharged home today.   Plan:  -continue inpatient psych admission; 15-minute checks; daily contact with patient to assess and evaluate symptoms and progress in treatment; psychoeducation.  -continue scheduled psych medications: . clonazePAM  0.5 mg Oral BID  . DULoxetine  20 mg Oral Daily    -continue PRN medications.  acetaminophen, alum & mag hydroxide-simeth,  hydrOXYzine, magnesium hydroxide  -Pertinent Labs: no new labs ordered today  -Consults: No new consults placed since yesterday    -Disposition:d/c home today with outpatient psych follow-up.    Thalia Party, MD 09/17/2019, 10:28 AM
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
## 2019-09-17 NOTE — Progress Notes (Signed)
D: Patient is alert and oriented. Patient rates depression 0/10, hopelessness 0/10, and anxiety 0/10. Patient stated goal to get discharge, I want to see my family. Patient reports energy level as normal and concentration as good.  Patient reports slept good last night. Patient did not receive medication for sleep. Reported left lower back/lateral hip physical pain of 5/10 today and received relief from Tylenol. Denies SI,HI, or AVH at this time.   A: Scheduled medications administered to patient per MD orders. Reassurance, support and encouragement provided. Verbally contracts for safety. Routine unit safety checks conducted Q 15 minutes.   R: Patient adhered to medication administration. No adverse drug reactions noted. Interacts well with others in milieu. Remains safe at this time, will continue to monitor.

## 2019-09-17 NOTE — Progress Notes (Signed)
  [facility] Adult Case Management Discharge Plan :  Will you be returning to the same living situation after discharge:  Yes,  Patient will be returning home with her family  At discharge, do you have transportation home?: Yes,  Patient stated her sister or brother will be picking her up.  Do you have the ability to pay for your medications: Yes,  Patient has the ability to pay for her medications   Release of information consent forms completed and in the chart;  Patient's signature needed at discharge.  Patient to Follow up at:  Follow-up Information    [facility] Follow up.   Specialty: [facility] information: [address] [place] [address] [phone]               Next level of care provider has access to [facility] Link:no  Safety Planning and Suicide Prevention discussed: Yes,  SPE completed      Has patient been referred to the Quitline?: N/A patient is not a smoker  Patient has been referred for addiction treatment: N/A  Susa Simmonds, LCSWA 09/17/2019, 12:19 PM

## 2019-09-17 NOTE — [facility] Suicide Risk Assessment (Signed)
[facility] Discharge Suicide Risk Assessment  Patient:  Ariel Ferguson is an 24 y.o., female MRN:  [id number] DOB:  [birth year]  Principal Problem: <principal problem not specified> Discharge Diagnoses: Active Problems:   MDD (major depressive disorder), severe (HCC)  On the day of discharge 08/13/19, the patient was considered an acute LOW risk of self harm. Patients current age represents non-modifiable/baseline risk factors. Presence of mental disorder(depression, anxiety, PTSD) isdynamic risk factor. The patient denies suicidal thoughts, denies a history ofintentionalsuicidal attempts, denies access to firearm. Patient is future-oriented, has responsibilities, help-seeking, has access to mental health care (resources given by SW), has family and community support, has cultural/religious beliefs that discourage suicide -all protective factors. Therefore, represents a low risk for harming self acutely and elevated chronic risk due to non-modifiable risk factors.  [place] Suicide Severity Rating Scale Wish to be dead: No Suicidal thoughts: No                 Suicidal thoughts with method: No                 Suicidal intent: No                 Suicide intent with specific plan: No                 Suicide behavior: No   Demographic Factors:  NA  Loss Factors: NA  Historical Factors: NA  Risk Reduction Factors:   Sense of responsibility to family, Religious beliefs about death, Living with another person, especially a relative and Positive social support  Continued Clinical Symptoms:  N/A  Cognitive Features That Contribute To Risk:  None    Suicide Risk:  Minimal: No identifiable suicidal ideation.  Patients presenting with no risk factors but with morbid ruminations; may be classified as minimal risk based on the severity of the depressive symptoms   Follow-up Information    [facility] Follow up.   Specialty: [facility] information: [address]  [place] [address] [phone]               Plan Of Care/Follow-up recommendations:  Emergency Contact Plan: Patient understands to call Suicide Hotline at 43, or Mobile Crisis at [phone], call 911, or go to the nearest ER as appropriate in case of thoughts of harm to self or others, or acute onset of intolerable side effects.  Thalia Party, MD 09/17/2019, 11:01 AM

## 2019-09-17 NOTE — [facility] Group Notes (Signed)
[facility] LCSW Group Therapy Note  Date/Time:  09/17/2019 1:18- 2:30 PM   Type of Therapy and Topic:  Group Therapy:  Healthy and Unhealthy Supports  Participation Level:  Active   Description of Group:  Patients in this group were introduced to the idea of adding a variety of healthy supports to address the various needs in their lives.Patients discussed what additional healthy supports could be helpful in their recovery and wellness after discharge in order to prevent future hospitalizations.   An emphasis was placed on using counselor, doctor, therapy groups, 12-step groups, and problem-specific support groups to expand supports.  They also worked as a group on developing a specific plan for several patients to deal with unhealthy supports through boundary-setting, psychoeducation with loved ones, and even termination of relationships.   Therapeutic Goals:   1)  discuss importance of adding supports to stay well once out of the hospital  2)  compare healthy versus unhealthy supports and identify some examples of each  3)  generate ideas and descriptions of healthy supports that can be added  4)  offer mutual support about how to address unhealthy supports  5)  encourage active participation in and adherence to discharge plan    Summary of Patient Progress:  Patient spoke about not having any unhealthy supports. Patient stated she can't remember a time when she did not have anyone supporting her. Patient spoke about her family and friends who are always there when she needs something. Patient also was encouraging to the other patients and making sure they were okay.  Therapeutic Modalities:   Motivational Interviewing Brief Solution-Focused Therapy  Susa Simmonds, Theresia Majors 09/17/2019  4:04 PM

## 2022-03-28 IMAGING — CR DG TIBIA/FIBULA 2V*L*
4 series · 4 of 4 positions shown · non-contrast
Comparison: None.

CLINICAL DATA: Initial evaluation for acute pain.

EXAM:
LEFT TIBIA AND FIBULA - 2 VIEW

[tibia ap (1 of 2)]
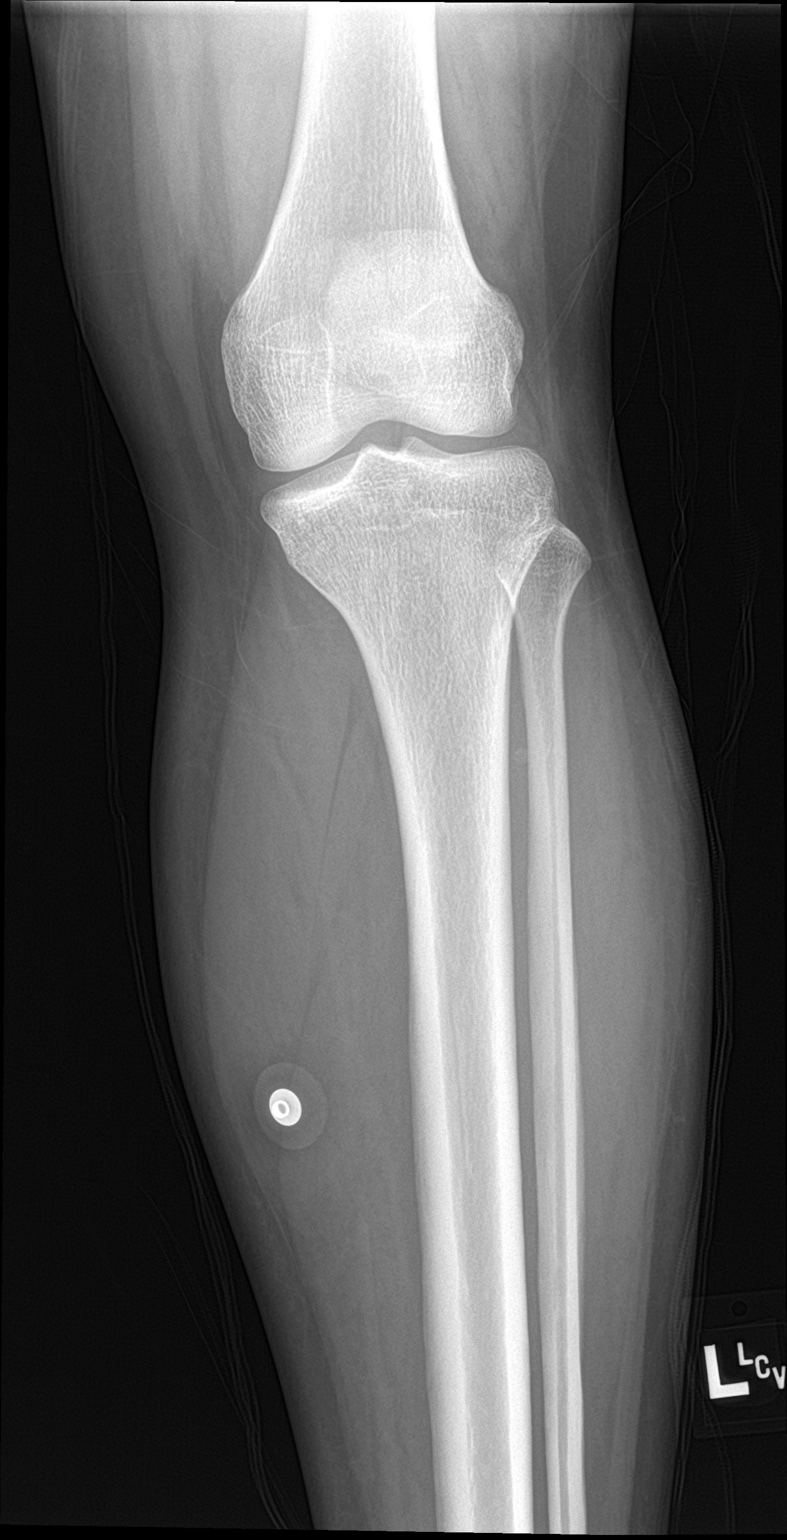

[tibia ap (2 of 2)]
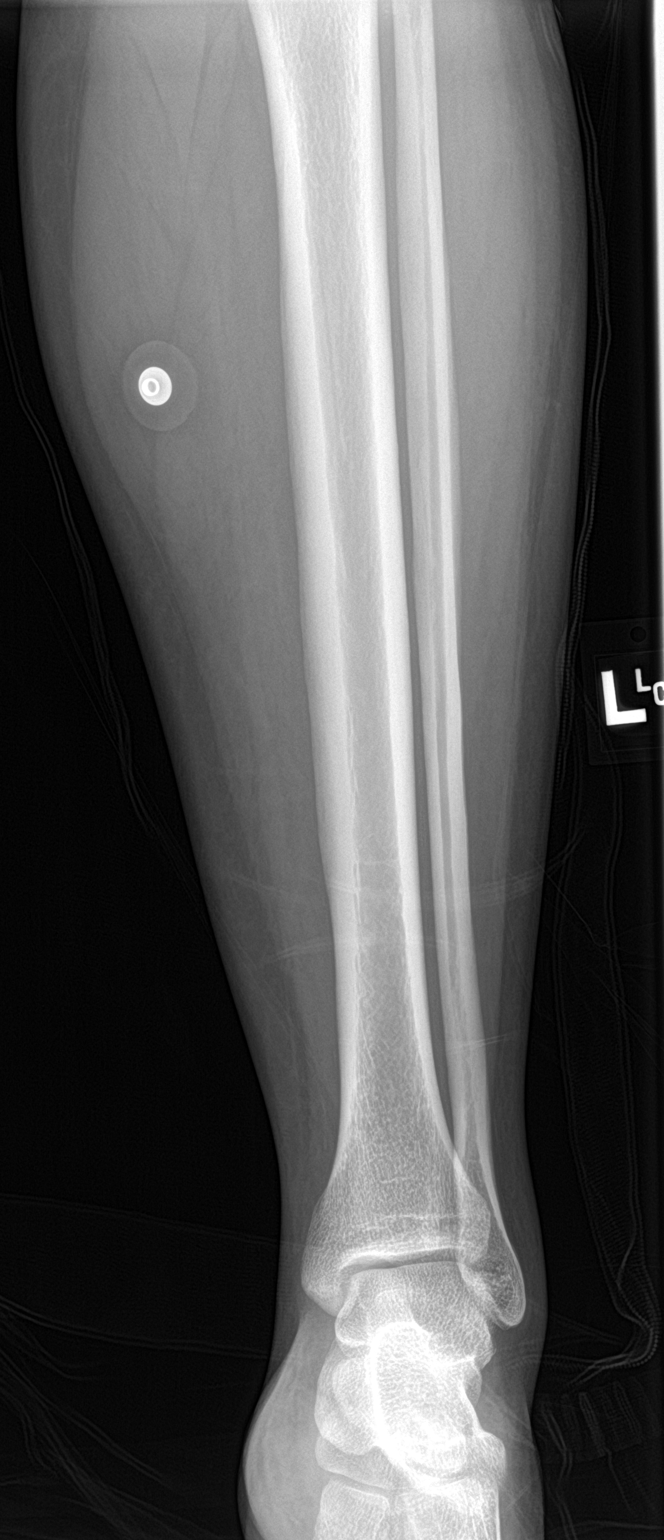

[tibia lat (1 of 2)]
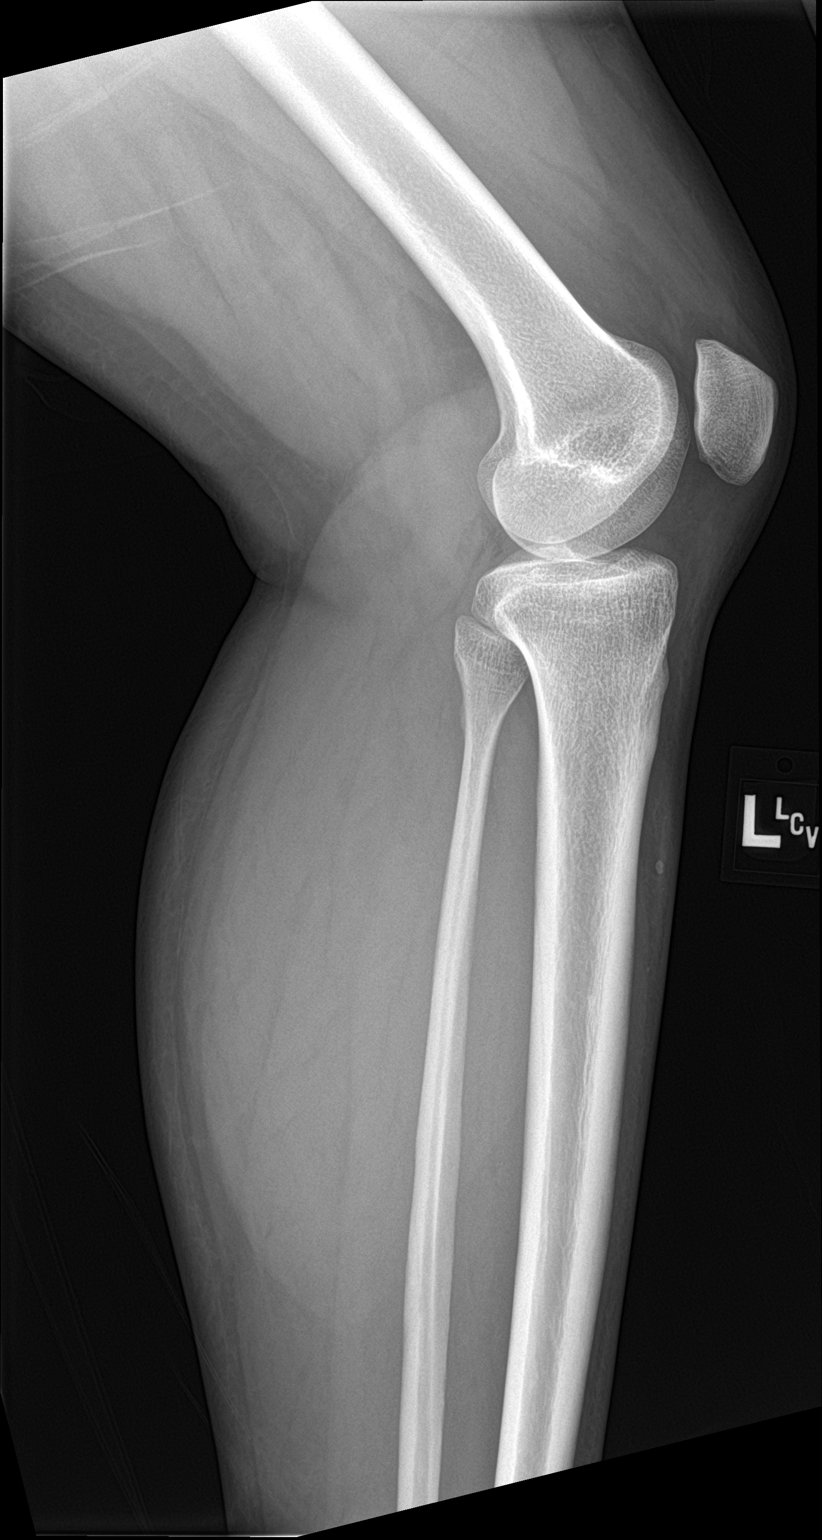

[tibia lat (2 of 2)]
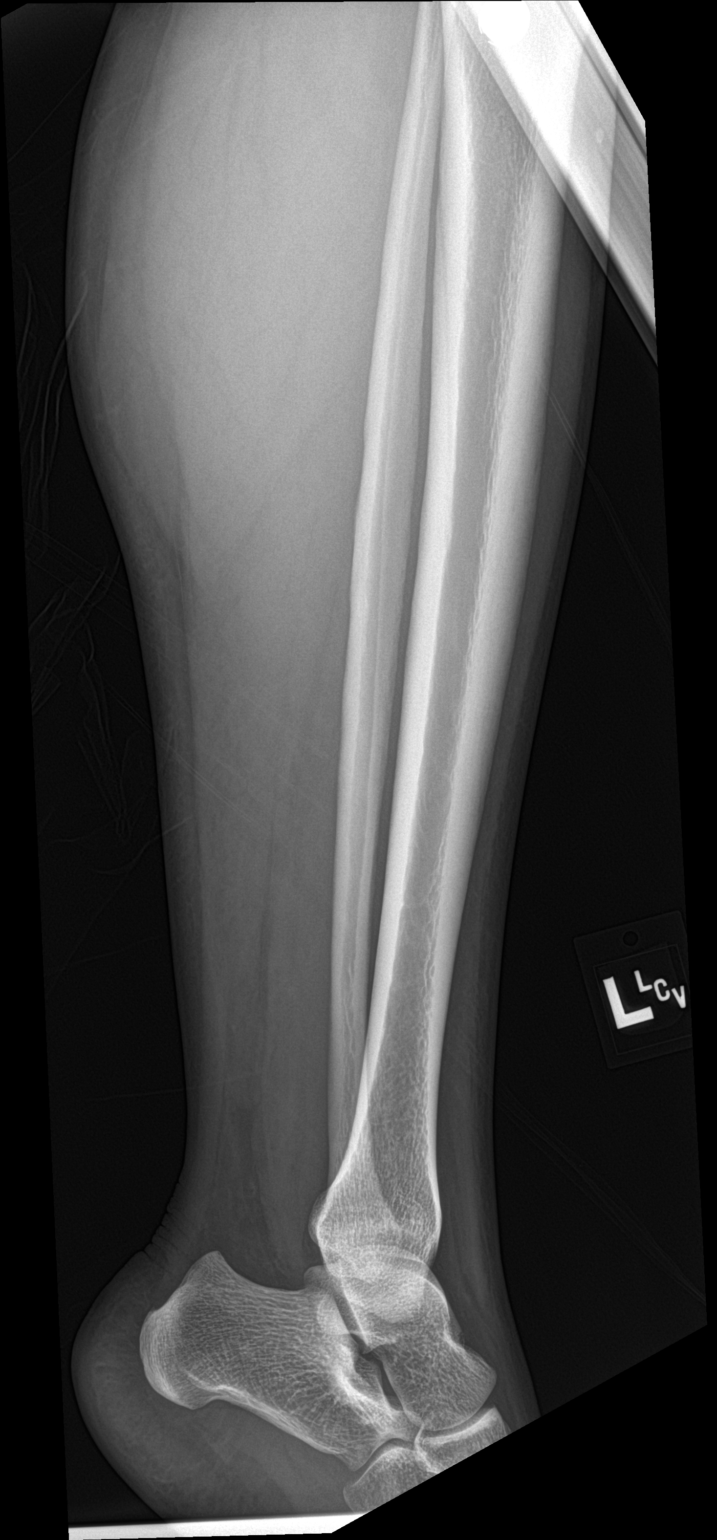

[4 of 4 positions shown; findings below may reference images not displayed]

FINDINGS: There is no evidence of fracture or other focal bone lesions. Soft
tissues are unremarkable.
IMPRESSION: Negative.
